# Patient Record
Sex: Female | Born: 1965 | Race: White | Hispanic: No | Marital: Married | Smoking: Former smoker
Health system: Southern US, Community
[De-identification: ages and names within clinical notes are randomized; demographics above are authoritative.]

## PROBLEM LIST (undated history)

## (undated) DIAGNOSIS — E785 Hyperlipidemia, unspecified: Secondary | ICD-10-CM

## (undated) DIAGNOSIS — L4050 Arthropathic psoriasis, unspecified: Secondary | ICD-10-CM

## (undated) DIAGNOSIS — E039 Hypothyroidism, unspecified: Secondary | ICD-10-CM

## (undated) HISTORY — DX: Hypothyroidism, unspecified: E03.9

## (undated) HISTORY — DX: Arthropathic psoriasis, unspecified: L40.50

## (undated) HISTORY — DX: Hyperlipidemia, unspecified: E78.5

## (undated) HISTORY — PX: PARTIAL HYSTERECTOMY: SHX80

## (undated) HISTORY — PX: KNEE SURGERY: SHX244

## (undated) HISTORY — PX: CHOLECYSTECTOMY: SHX55

---

## 2005-06-15 ENCOUNTER — Ambulatory Visit: Payer: Self-pay | Admitting: Internal Medicine

## 2005-06-19 ENCOUNTER — Ambulatory Visit: Payer: Self-pay | Admitting: Internal Medicine

## 2005-06-29 ENCOUNTER — Ambulatory Visit: Payer: Self-pay | Admitting: General Surgery

## 2005-07-06 ENCOUNTER — Ambulatory Visit: Payer: Self-pay | Admitting: General Surgery

## 2006-04-29 ENCOUNTER — Ambulatory Visit: Payer: Self-pay | Admitting: Family Medicine

## 2006-09-21 ENCOUNTER — Ambulatory Visit: Payer: Self-pay | Admitting: Unknown Physician Specialty

## 2016-10-08 ENCOUNTER — Other Ambulatory Visit: Payer: Self-pay | Admitting: Family Medicine

## 2016-10-08 DIAGNOSIS — R221 Localized swelling, mass and lump, neck: Secondary | ICD-10-CM

## 2016-10-27 ENCOUNTER — Ambulatory Visit
Admission: RE | Admit: 2016-10-27 | Discharge: 2016-10-27 | Disposition: A | Discharge status: Home / Self Care | Payer: BC Managed Care – PPO | Source: Ambulatory Visit | Attending: Family Medicine | Admitting: Family Medicine

## 2016-10-27 DIAGNOSIS — R221 Localized swelling, mass and lump, neck: Secondary | ICD-10-CM

## 2016-11-02 ENCOUNTER — Ambulatory Visit
Admission: RE | Admit: 2016-11-02 | Discharge: 2016-11-02 | Disposition: A | Discharge status: Home / Self Care | Payer: BC Managed Care – PPO | Source: Ambulatory Visit | Attending: Family Medicine | Admitting: Family Medicine

## 2016-11-02 ENCOUNTER — Other Ambulatory Visit: Payer: Self-pay | Admitting: Family Medicine

## 2016-11-02 DIAGNOSIS — R221 Localized swelling, mass and lump, neck: Secondary | ICD-10-CM

## 2016-11-02 MED ORDER — IOPAMIDOL (ISOVUE-300) INJECTION 61%
75.0000 mL | Freq: Once | INTRAVENOUS | Status: AC | PRN
  Administered 2016-11-02: 75 mL via INTRAVENOUS

## 2019-05-20 ENCOUNTER — Ambulatory Visit: Payer: BC Managed Care – PPO | Attending: Internal Medicine

## 2019-05-20 DIAGNOSIS — Z23 Encounter for immunization: Secondary | ICD-10-CM

## 2019-05-20 NOTE — Progress Notes (Signed)
   Covid-19 Vaccination Clinic  Name:  Angela Barton    MRN: [id number] DOB: Mar 05, 1966  05/20/2019  [name] was observed post Covid-19 immunization for 15 minutes without incidence. She was provided with Vaccine Information Sheet and instruction to access the V-Safe system.   [name] was instructed to call 911 with any severe reactions post vaccine: Marland Kitchen Difficulty breathing  . Swelling of your face and throat  . A fast heartbeat  . A bad rash all over your body  . Dizziness and weakness    Immunizations Administered    Name Date Dose VIS Date Route   Pfizer COVID-19 Vaccine 05/20/2019  1:35 PM 0.3 mL 03/03/2019 Intramuscular   Manufacturer: [employer], [employer]   Lot: [id number]   NDC: [address]-8

## 2019-06-10 ENCOUNTER — Ambulatory Visit: Payer: BC Managed Care – PPO | Attending: Internal Medicine

## 2019-06-10 DIAGNOSIS — Z23 Encounter for immunization: Secondary | ICD-10-CM

## 2019-06-10 NOTE — Progress Notes (Signed)
   Covid-19 Vaccination Clinic  Name:  Harlan Vinal    MRN: [id number] DOB: [birth year]  06/10/2019  Ms. Dercole was observed post Covid-19 immunization for 15 minutes without incident. She was provided with Vaccine Information Sheet and instruction to access the V-Safe system.   Ms. Remington was instructed to call 911 with any severe reactions post vaccine: Marland Kitchen Difficulty breathing  . Swelling of face and throat  . A fast heartbeat  . A bad rash all over body  . Dizziness and weakness   Immunizations Administered    Name Date Dose VIS Date Route   Pfizer COVID-19 Vaccine 06/10/2019 11:13 AM 0.3 mL 03/03/2019 Intramuscular   Manufacturer: [employer], [employer]   Lot: [id number]   NDC: [address]-2

## 2019-06-14 ENCOUNTER — Ambulatory Visit: Payer: BC Managed Care – PPO

## 2020-02-22 ENCOUNTER — Ambulatory Visit: Payer: BC Managed Care – PPO | Admitting: Podiatry

## 2020-02-22 ENCOUNTER — Ambulatory Visit (INDEPENDENT_AMBULATORY_CARE_PROVIDER_SITE_OTHER): Payer: BC Managed Care – PPO

## 2020-02-22 ENCOUNTER — Other Ambulatory Visit: Payer: Self-pay

## 2020-02-22 DIAGNOSIS — M779 Enthesopathy, unspecified: Secondary | ICD-10-CM

## 2020-02-22 DIAGNOSIS — M79673 Pain in unspecified foot: Secondary | ICD-10-CM | POA: Diagnosis not present

## 2020-02-22 DIAGNOSIS — M19079 Primary osteoarthritis, unspecified ankle and foot: Secondary | ICD-10-CM

## 2020-02-22 DIAGNOSIS — M19072 Primary osteoarthritis, left ankle and foot: Secondary | ICD-10-CM | POA: Diagnosis not present

## 2020-02-22 DIAGNOSIS — M19071 Primary osteoarthritis, right ankle and foot: Secondary | ICD-10-CM | POA: Diagnosis not present

## 2020-02-22 DIAGNOSIS — G8929 Other chronic pain: Secondary | ICD-10-CM

## 2020-02-22 DIAGNOSIS — L4050 Arthropathic psoriasis, unspecified: Secondary | ICD-10-CM

## 2020-02-27 DIAGNOSIS — L4050 Arthropathic psoriasis, unspecified: Secondary | ICD-10-CM | POA: Insufficient documentation

## 2020-02-27 NOTE — Progress Notes (Signed)
Subjective:   Patient ID: Angela Barton, female   DOB: 54 y.o.   MRN: [id number]   HPI 54 year old female presents the office today for concerns of bilateral foot pain.  She states that she works 7 hours a day on her feet.  August 2021 she states that she was having left foot ankle pain and she had swelling.  She went to urgent care clinic at [facility] there was given a brace.  At the time they thought there was a sprain but she denies any injury.  On November 10 she was given meloxicam.  She has some occasional swelling to the left foot.  Describes a sore, sharp radiating pain at times.  She states that walking makes the symptoms worse.  She tried elevating and icing.  Review of Systems  All other systems reviewed and are negative.  No past medical history on file.   Current Outpatient Medications:  .  estradiol (ESTRACE) 1 MG tablet, estradiol 1 mg tablet, Disp: , Rfl:  .  fluconazole (DIFLUCAN) 150 MG tablet, fluconazole 150 mg tablet, Disp: , Rfl:  .  hydrocortisone 2.5 % cream, hydrocortisone 2.5 % topical cream  APPLY TO AFFECTED AREA TWICE A DAY, Disp: , Rfl:  .  doxycycline (VIBRA-TABS) 100 MG tablet, doxycycline hyclate 100 mg tablet, Disp: , Rfl:  .  gabapentin (NEURONTIN) 300 MG capsule, gabapentin 300 mg capsule  TAKE 1 CAPSULE BY MOUTH TWICE A DAY, Disp: , Rfl:  .  OTEZLA 30 MG TABS, Take 1 tablet by mouth 2 (two) times daily., Disp: , Rfl:   Allergies  Allergen Reactions  . Penicillin G Itching          Objective:  Physical Exam  General: AAO x3, NAD  Dermatological: Skin is warm, dry and supple bilateral. There are no open sores, no preulcerative lesions, no rash or signs of infection present.  Vascular: Dorsalis Pedis artery and Posterior Tibial artery pedal pulses are 2/4 bilateral with immedate capillary fill time.  There is no pain with calf compression, swelling, warmth, erythema.   Neruologic: Grossly intact via light touch bilateral.  Musculoskeletal: There is  tenderness palpation on dorsal aspect midfoot mostly on the Lisfranc, midfoot joint.  There is no specific area of pinpoint tenderness.  There is mild edema but there is no erythema or warmth.  There is no specific area of pinpoint tenderness.  Flexor, extensor tendons.  Intact.  MMT 5/5.  Gait: Unassisted, Nonantalgic.       Assessment:   Left foot capsulitis     Plan:  -Treatment options discussed including all alternatives, risks, and complications -Etiology of symptoms were discussed -X-rays were obtained and reviewed with the patient.  Mild arthritis along Lisfranc joint, second metatarsocuneiform joint.  No evidence of fracture noted today. -Discussed her foot type as well as arthritis.  We will try to accommodative type orthotics to help give her some support.  She was measured for inserts today with Raiford Noble.  Continue meloxicam as needed.  Offered steroid injection.  Vivi Barrack DPM

## 2020-03-04 ENCOUNTER — Ambulatory Visit (INDEPENDENT_AMBULATORY_CARE_PROVIDER_SITE_OTHER): Payer: BC Managed Care – PPO | Admitting: Orthotics

## 2020-03-04 ENCOUNTER — Other Ambulatory Visit: Payer: Self-pay

## 2020-03-04 DIAGNOSIS — M779 Enthesopathy, unspecified: Secondary | ICD-10-CM

## 2020-03-04 DIAGNOSIS — M79673 Pain in unspecified foot: Secondary | ICD-10-CM

## 2020-03-04 DIAGNOSIS — M19071 Primary osteoarthritis, right ankle and foot: Secondary | ICD-10-CM | POA: Diagnosis not present

## 2020-03-04 DIAGNOSIS — M19072 Primary osteoarthritis, left ankle and foot: Secondary | ICD-10-CM | POA: Diagnosis not present

## 2020-03-04 DIAGNOSIS — G8929 Other chronic pain: Secondary | ICD-10-CM

## 2020-03-04 DIAGNOSIS — M19079 Primary osteoarthritis, unspecified ankle and foot: Secondary | ICD-10-CM

## 2020-03-04 NOTE — Progress Notes (Signed)
Patient came into today to be cast for Custom Foot Orthotics. Upon recommendation of Dr. Ardelle Anton Patient presents with midfoot/LF OA Goals are longitudinal arch support Plan vendor [facility]

## 2020-04-02 ENCOUNTER — Encounter: Payer: BC Managed Care – PPO | Admitting: Orthotics

## 2020-04-02 ENCOUNTER — Other Ambulatory Visit: Payer: Self-pay

## 2020-04-02 ENCOUNTER — Ambulatory Visit: Payer: Self-pay | Admitting: Orthotics

## 2020-04-02 DIAGNOSIS — M779 Enthesopathy, unspecified: Secondary | ICD-10-CM

## 2020-04-02 NOTE — Progress Notes (Signed)
Patient came into today to be cast for Custom Foot Orthotics. Upon recommendation of Dr.  Patient presents with Goals are Plan vendor  

## 2021-04-02 ENCOUNTER — Other Ambulatory Visit: Payer: Self-pay | Admitting: Family Medicine

## 2021-04-02 DIAGNOSIS — E039 Hypothyroidism, unspecified: Secondary | ICD-10-CM

## 2021-04-17 ENCOUNTER — Ambulatory Visit
Admission: RE | Admit: 2021-04-17 | Discharge: 2021-04-17 | Disposition: A | Discharge status: Home / Self Care | Payer: BC Managed Care – PPO | Source: Ambulatory Visit | Attending: Family Medicine | Admitting: Family Medicine

## 2021-04-17 DIAGNOSIS — E039 Hypothyroidism, unspecified: Secondary | ICD-10-CM

## 2022-02-09 ENCOUNTER — Ambulatory Visit: Payer: Self-pay

## 2022-02-09 ENCOUNTER — Other Ambulatory Visit: Payer: Self-pay | Admitting: Family Medicine

## 2022-02-09 DIAGNOSIS — R52 Pain, unspecified: Secondary | ICD-10-CM

## 2022-02-18 ENCOUNTER — Ambulatory Visit: Payer: BC Managed Care – PPO | Attending: Cardiovascular Disease | Admitting: Cardiovascular Disease

## 2022-02-18 ENCOUNTER — Encounter: Payer: Self-pay | Admitting: Cardiovascular Disease

## 2022-02-18 ENCOUNTER — Ambulatory Visit (INDEPENDENT_AMBULATORY_CARE_PROVIDER_SITE_OTHER): Payer: BC Managed Care – PPO

## 2022-02-18 VITALS — BP 118/82 | HR 72 | Ht 67.0 in | Wt 222.2 lb

## 2022-02-18 DIAGNOSIS — R002 Palpitations: Secondary | ICD-10-CM

## 2022-02-18 NOTE — Progress Notes (Unsigned)
Enrolled for [employer] to mail a ZIO XT long term holter monitor to the patients address on file.  

## 2022-02-18 NOTE — Patient Instructions (Signed)
Medication Instructions:  No changes *If you need a refill on your cardiac medications before your next appointment, please call your pharmacy*   Lab Work: none   Testing/Procedures: Your physician has requested that you have an echocardiogram. Echocardiography is a painless test that uses sound waves to create images of your heart. It provides your doctor with information about the size and shape of your heart and how well your heart's chambers and valves are working. This procedure takes approximately one hour. There are no restrictions for this procedure. Please do NOT wear cologne, perfume, aftershave, or lotions (deodorant is allowed). Please arrive 15 minutes prior to your appointment time.  14 day Zio Heart Monitor - see instructions below   Follow-Up: As needed  Important Information About Sugar

## 2022-02-18 NOTE — Progress Notes (Signed)
Chief Complaint  Patient presents with   New Patient (Initial Visit)    Chest pain   History of Present Illness: 56 yo female with history of hypothyroidism, psoriatic arthritis and hyperlipidemia who is here today as a new consult, referred by Dr. Lucianne Muss, for the evaluation of chest pain. She has no known cardiac disease. She describes feeling her heart flutter once per week. This lasts for ten minutes. Associated with dyspnea and a quick sharp chest pain. She has LE edema for years but this resolves at night.   Primary Care Physician: Reubin Milan, MD   Past Medical History:  Diagnosis Date   Hyperlipidemia    Hypothyroidism    Psoriatic arthritis [facility])     Past Surgical History:  Procedure Laterality Date   CHOLECYSTECTOMY     KNEE SURGERY     PARTIAL HYSTERECTOMY      Current Outpatient Medications  Medication Sig Dispense Refill   atorvastatin (LIPITOR) 20 MG tablet Take 20 mg by mouth daily.     doxycycline (VIBRA-TABS) 100 MG tablet doxycycline hyclate 100 mg tablet     estradiol (ESTRACE) 1 MG tablet estradiol 1 mg tablet     fluconazole (DIFLUCAN) 150 MG tablet fluconazole 150 mg tablet     gabapentin (NEURONTIN) 300 MG capsule gabapentin 300 mg capsule  TAKE 1 CAPSULE BY MOUTH TWICE A DAY     hydrocortisone 2.5 % cream hydrocortisone 2.5 % topical cream  APPLY TO AFFECTED AREA TWICE A DAY     levothyroxine (SYNTHROID) 125 MCG tablet Take 125 mcg by mouth daily before breakfast.     OTEZLA 30 MG TABS Take 1 tablet by mouth 2 (two) times daily.     Risankizumab-rzaa,150 MG Dose, (SKYRIZI, 150 MG DOSE,) 75 MG/0.83ML PSKT Take 150 mg by mouth every 3 (three) months.     No current facility-administered medications for this visit.    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  Tobacco Use   Smoking status: Former    Packs/day: 0.50    Types: Cigarettes    Quit date: 02/17/1990    Years since quitting: 32.0   Smokeless tobacco: Never  Substance and Sexual Activity   Alcohol use: Not Currently   Drug use: Never   Sexual activity: Not on file  Other Topics Concern   Not on file  Social History Narrative   Not on file   Social Determinants of Health   Financial Resource Strain: Not on file  Food Insecurity: Not on file  Transportation Needs: Not on file  Physical Activity: Not on file  Stress: Not on file  Social Connections: Not on file  Intimate Partner Violence: Not on file    Family History  Problem Relation Age of Onset   Stroke Mother    Anuerysm Mother    Anuerysm Father    Heart attack Maternal Uncle    Heart attack Maternal Grandfather     Review of Systems:  As stated in the HPI and otherwise negative.   BP 118/82   Pulse 72   Ht 5\' 7"  (1.702 m)   Wt 222 lb 3.2 oz (100.8 kg)  SpO2 94%   BMI 34.80 kg/m   Physical Examination: General: Well developed, well nourished, NAD  HEENT: OP clear, mucus membranes moist  SKIN: warm, dry. No rashes. Neuro: No focal deficits  Musculoskeletal: Muscle strength 5/5 all ext  Psychiatric: Mood and affect normal  Neck: No JVD, no carotid bruits, no thyromegaly, no lymphadenopathy.  Lungs:Clear bilaterally, no wheezes, rhonci, crackles Cardiovascular: Regular rate and rhythm. No murmurs, gallops or rubs. Abdomen:Soft. Bowel sounds present. Non-tender.  Extremities: No lower extremity edema. Pulses are 2 + in the bilateral DP/PT.  EKG:  EKG is ordered today. The ekg ordered today demonstrates NSR  Recent Labs: No results found for requested labs within last 365 days.   Lipid Panel No results found for: "CHOL", "TRIG", "HDL", "CHOLHDL", "VLDL", "LDLCALC", "LDLDIRECT"   Wt Readings from Last 3  Encounters:  02/18/22 222 lb 3.2 oz (100.8 kg)    Assessment and Plan:   1. Palpitations: TSH normal in primary care. Will arrange a 14 day Zio cardiac monitor. Will arrange an echo to assess LVEF and exclude structural heart disease.   Labs/ tests ordered today include:   Orders Placed This Encounter  Procedures   LONG TERM MONITOR (3-14 DAYS)   EKG 12-Lead   ECHOCARDIOGRAM COMPLETE   Disposition:   F/U with me as needed.   Signed, Lauree Chandler, MD, [facility] 02/18/2022 [facility] [place], [place], [place]  [address] Phone: [phone]; Fax: [phone]

## 2022-02-22 DIAGNOSIS — R002 Palpitations: Secondary | ICD-10-CM | POA: Diagnosis not present

## 2022-03-12 ENCOUNTER — Ambulatory Visit (HOSPITAL_COMMUNITY): Payer: BC Managed Care – PPO | Attending: Cardiovascular Disease

## 2022-03-12 DIAGNOSIS — R002 Palpitations: Secondary | ICD-10-CM | POA: Insufficient documentation

## 2022-03-12 DIAGNOSIS — I5030 Unspecified diastolic (congestive) heart failure: Secondary | ICD-10-CM | POA: Diagnosis not present

## 2022-03-12 LAB — ECHOCARDIOGRAM COMPLETE
Area-P 1/2: 3.48 cm2
S' Lateral: 2.5 cm

## 2022-12-29 ENCOUNTER — Other Ambulatory Visit: Payer: Self-pay

## 2024-01-13 IMAGING — US US THYROID
1 series · 14 of 25 positions shown · non-contrast
Comparison: None.

CLINICAL DATA: Acquired hypothyroidism; history of thyroid
ablation, palpable abnormality

EXAM:
THYROID ULTRASOUND
TECHNIQUE: Ultrasound examination of the thyroid gland and adjacent soft
tissues was performed.

[Series 1: us thyroid · 0.05mm/px · 14 of 27 slices shown]
[im 1/27]
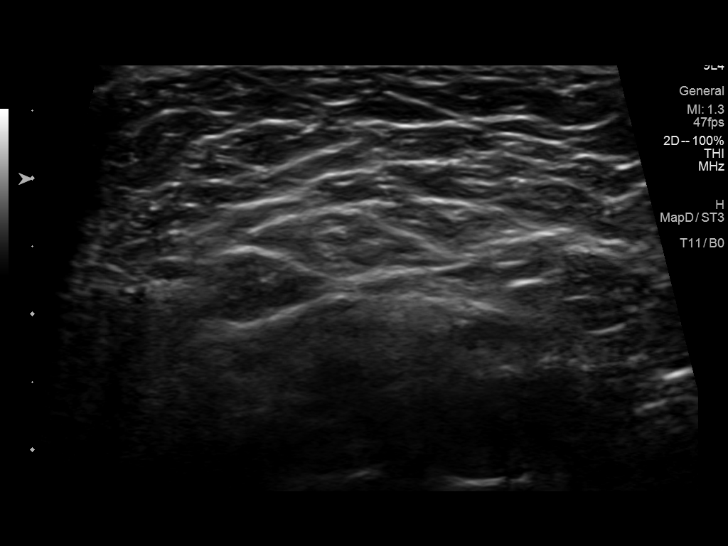
[im 3/27]
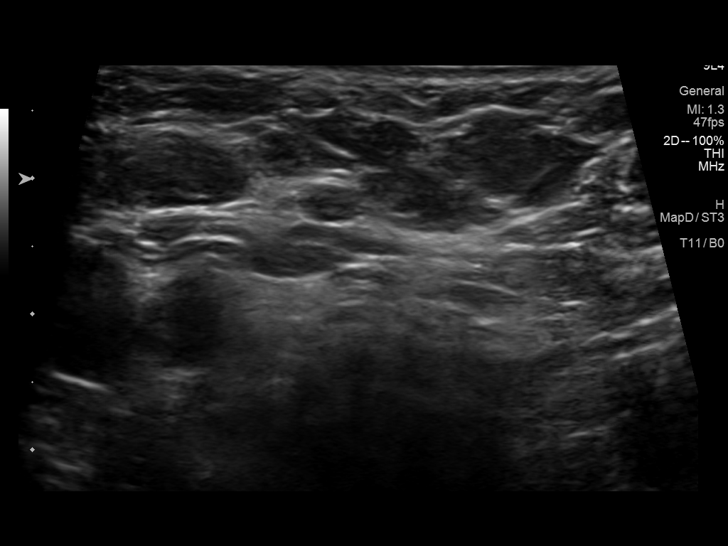
[im 5/27]
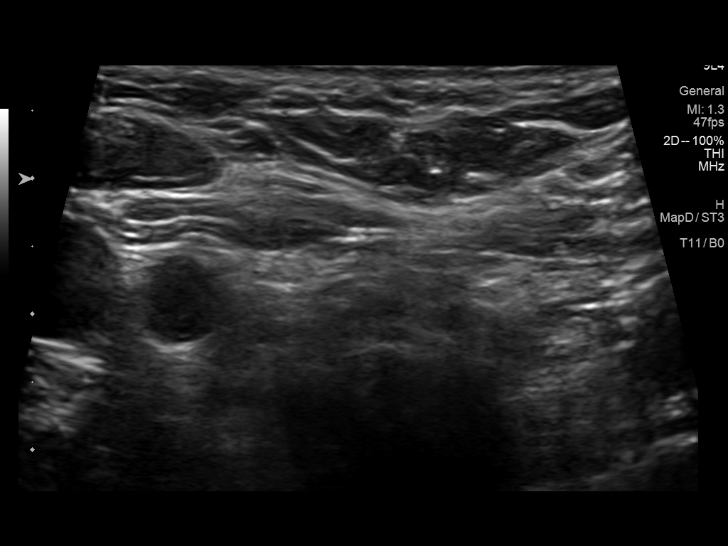
[im 7/27]
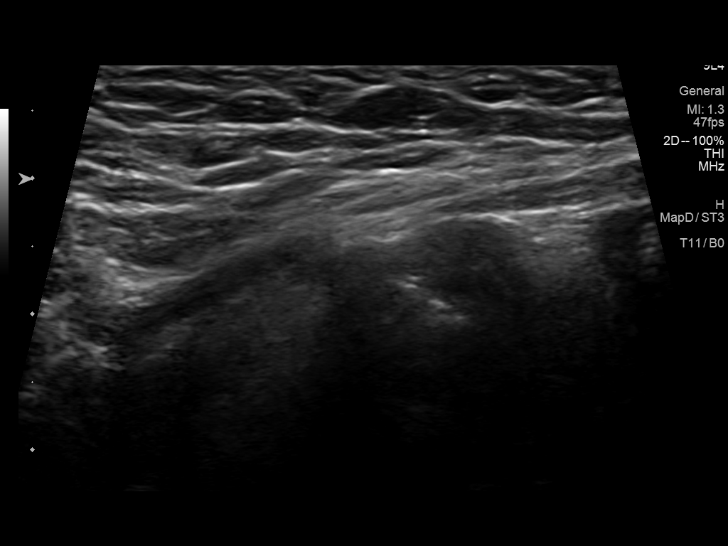
[im 9/27]
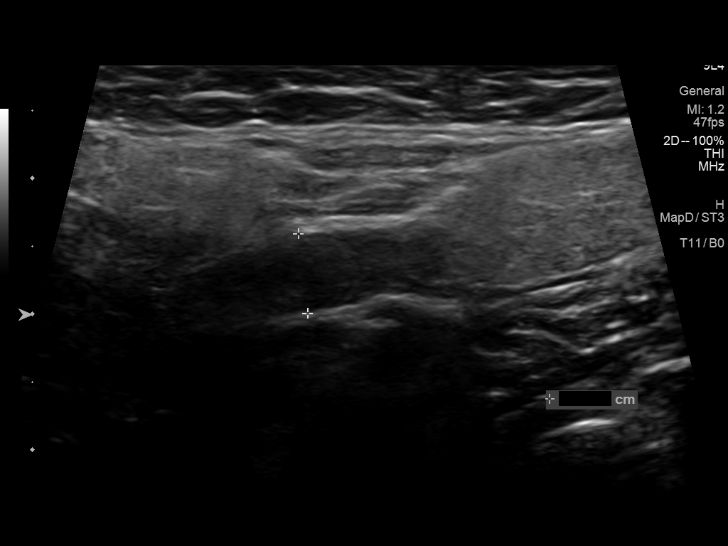
[im 10/27]
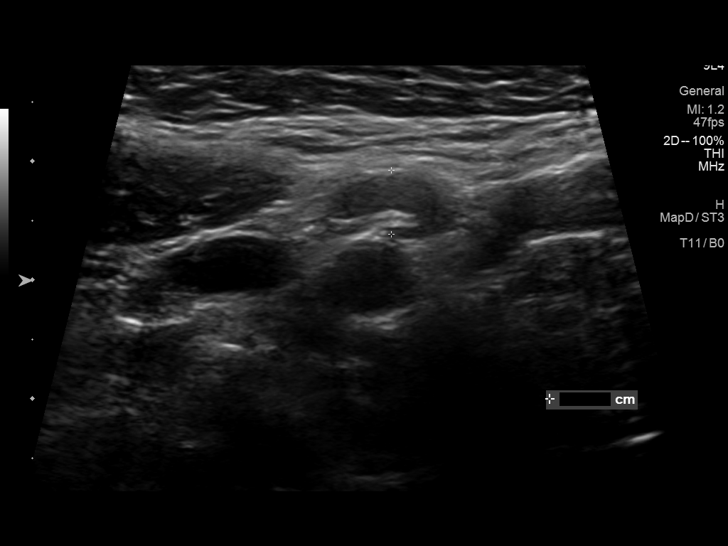
[im 12/27]
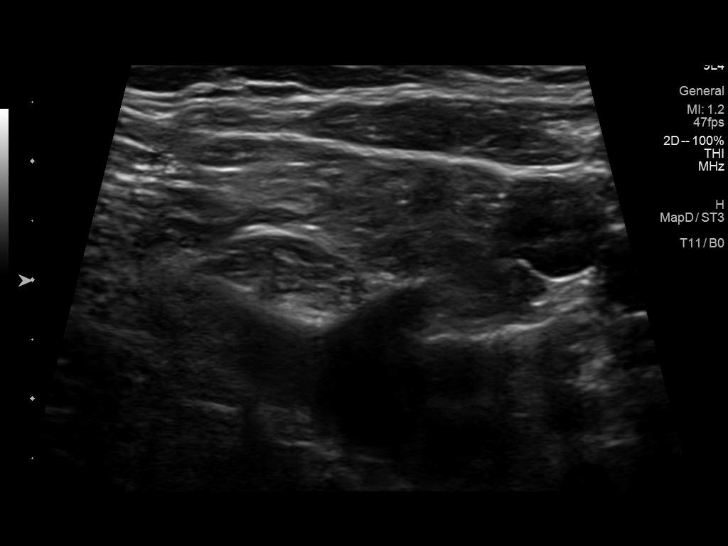
[im 15/27]
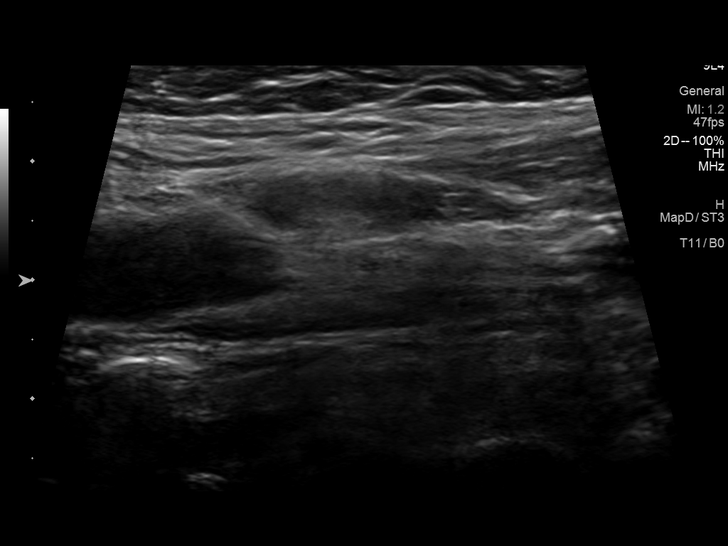
[im 17/27]
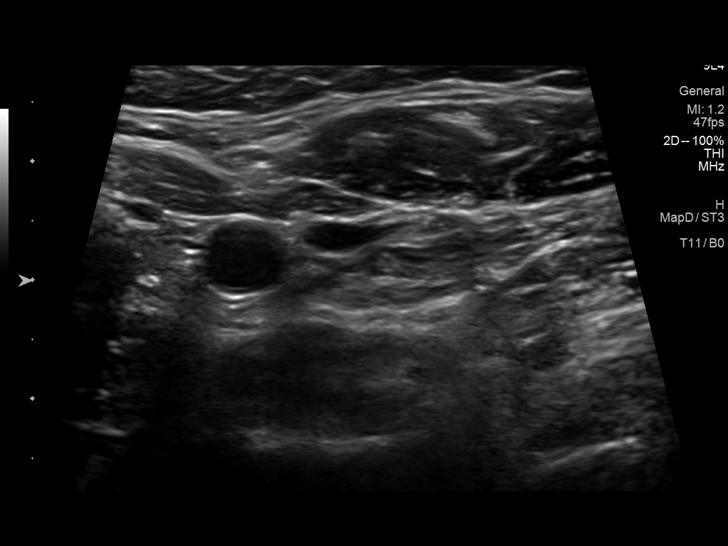
[im 18/27]
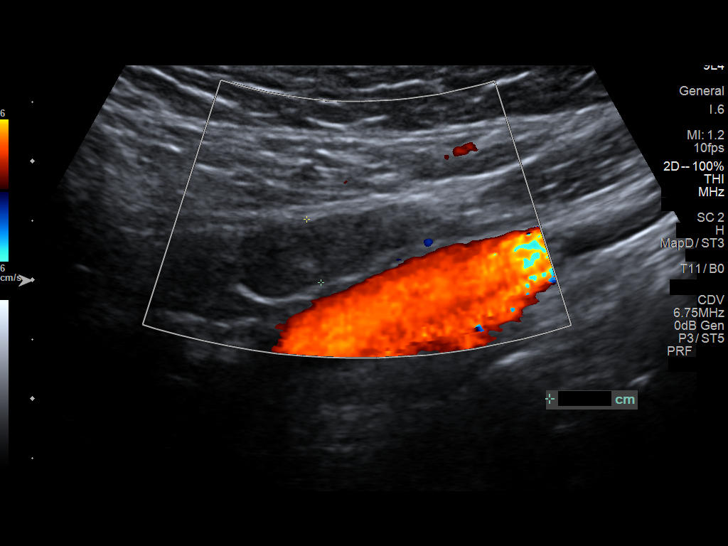
[im 20/27]
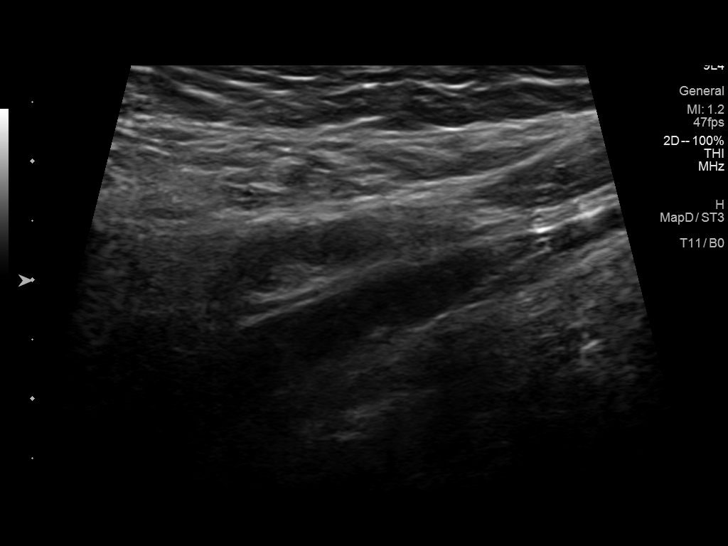
[im 22/27]
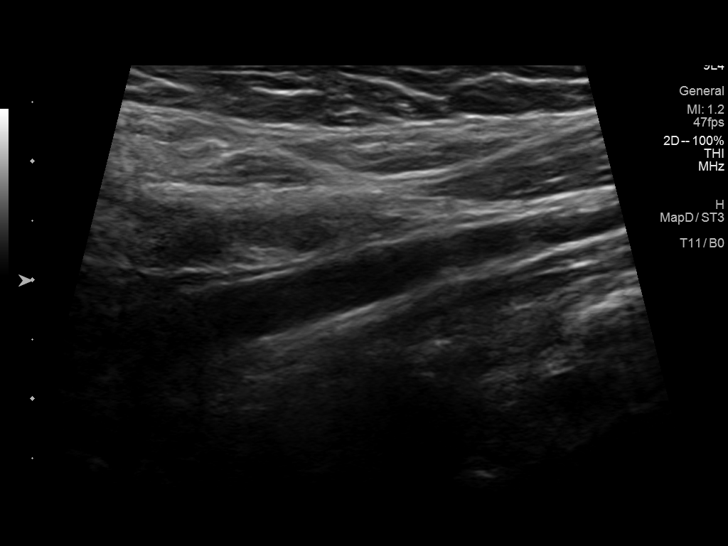
[im 24/27]
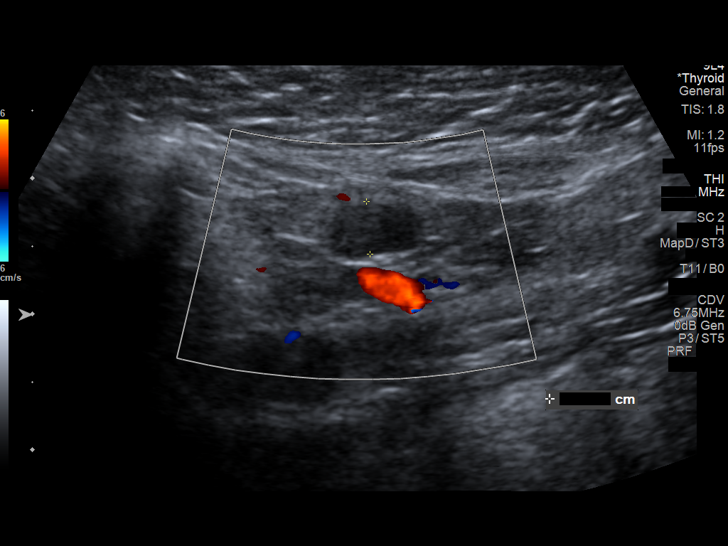
[im 27/27]
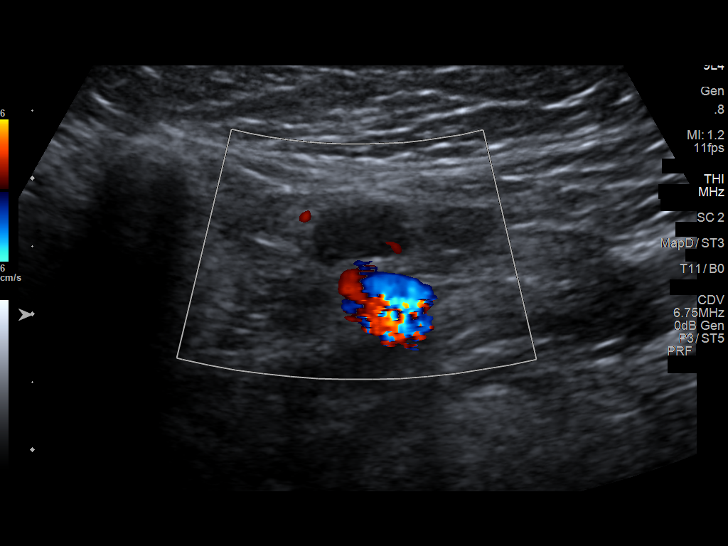

[14 of 25 positions shown; findings below may reference images not displayed]

FINDINGS: No residual thyroid tissue identified on exam. Previously described
hypoechoic nodule corresponding to palpable abnormality is not
identified on today's exam.

A few normal-appearing lymph nodes which measure normal cortical
thickness in the short axis and preserved fatty hilum/morphology are
seen on today's exam.
IMPRESSION: 1. No residual thyroid tissue identified in the neck.
2. Previously described small nodule corresponding to palpable
abnormality is not identified on today's exam.
3. Scattered normal-appearing lymph nodes.
